# Patient Record
Sex: Male | Born: 1955 | Race: White | Hispanic: No | Marital: Married | Smoking: Former smoker
Health system: Southern US, Community
[De-identification: ages and names within clinical notes are randomized; demographics above are authoritative.]

## PROBLEM LIST (undated history)

## (undated) DIAGNOSIS — N2889 Other specified disorders of kidney and ureter: Secondary | ICD-10-CM

## (undated) HISTORY — PX: CHOLECYSTECTOMY: SHX55

## (undated) HISTORY — PX: ANTERIOR CRUCIATE LIGAMENT REPAIR: SHX115

---

## 2011-07-26 ENCOUNTER — Other Ambulatory Visit (HOSPITAL_BASED_OUTPATIENT_CLINIC_OR_DEPARTMENT_OTHER): Payer: Self-pay | Admitting: Family Medicine

## 2011-07-26 DIAGNOSIS — R109 Unspecified abdominal pain: Secondary | ICD-10-CM

## 2011-07-27 ENCOUNTER — Ambulatory Visit (HOSPITAL_BASED_OUTPATIENT_CLINIC_OR_DEPARTMENT_OTHER)
Admission: RE | Admit: 2011-07-27 | Discharge: 2011-07-27 | Disposition: A | Discharge status: Home / Self Care | Payer: PRIVATE HEALTH INSURANCE | Source: Ambulatory Visit | Attending: Family Medicine | Admitting: Family Medicine

## 2011-07-27 DIAGNOSIS — K4020 Bilateral inguinal hernia, without obstruction or gangrene, not specified as recurrent: Secondary | ICD-10-CM

## 2011-07-27 DIAGNOSIS — K4090 Unilateral inguinal hernia, without obstruction or gangrene, not specified as recurrent: Secondary | ICD-10-CM | POA: Insufficient documentation

## 2011-07-27 DIAGNOSIS — R109 Unspecified abdominal pain: Secondary | ICD-10-CM | POA: Insufficient documentation

## 2011-07-27 MED ORDER — IOHEXOL 300 MG/ML  SOLN
100.0000 mL | Freq: Once | INTRAMUSCULAR | Status: AC | PRN
  Administered 2011-07-27: 100 mL via INTRAVENOUS

## 2011-08-02 ENCOUNTER — Other Ambulatory Visit (HOSPITAL_BASED_OUTPATIENT_CLINIC_OR_DEPARTMENT_OTHER): Payer: Self-pay | Admitting: Family Medicine

## 2011-08-02 ENCOUNTER — Other Ambulatory Visit (HOSPITAL_BASED_OUTPATIENT_CLINIC_OR_DEPARTMENT_OTHER): Payer: PRIVATE HEALTH INSURANCE

## 2011-08-02 DIAGNOSIS — M545 Low back pain: Secondary | ICD-10-CM

## 2011-08-09 ENCOUNTER — Other Ambulatory Visit (HOSPITAL_BASED_OUTPATIENT_CLINIC_OR_DEPARTMENT_OTHER): Payer: PRIVATE HEALTH INSURANCE

## 2016-08-20 ENCOUNTER — Emergency Department
Admission: EM | Admit: 2016-08-20 | Discharge: 2016-08-20 | Discharge status: Against Medical Advice | Payer: BLUE CROSS/BLUE SHIELD | Source: Home / Self Care | Attending: Family Medicine | Admitting: Family Medicine

## 2016-08-20 ENCOUNTER — Encounter: Payer: Self-pay | Admitting: Emergency Medicine

## 2016-08-20 ENCOUNTER — Emergency Department (INDEPENDENT_AMBULATORY_CARE_PROVIDER_SITE_OTHER): Payer: BLUE CROSS/BLUE SHIELD

## 2016-08-20 DIAGNOSIS — R0602 Shortness of breath: Secondary | ICD-10-CM | POA: Diagnosis not present

## 2016-08-20 DIAGNOSIS — G8929 Other chronic pain: Secondary | ICD-10-CM

## 2016-08-20 DIAGNOSIS — M546 Pain in thoracic spine: Secondary | ICD-10-CM

## 2016-08-20 DIAGNOSIS — R079 Chest pain, unspecified: Secondary | ICD-10-CM

## 2016-08-20 HISTORY — DX: Other specified disorders of kidney and ureter: N28.89

## 2016-08-20 NOTE — ED Provider Notes (Signed)
[facility]    CSN: [id number] Arrival date & time: 08/20/16  1605  First Provider Contact:  First MD Initiated Contact with Patient 08/20/16 1728        History   Chief Complaint Chief Complaint  Patient presents with  . Shortness of Breath    HPI Vernon Jenkins is a 60 y.o. male.   Patient complains of 2 to 3 week history of recurring sharp pain in his right posterior chest.  He denies cough and the pain is not pleuritic, although he feels somewhat short of breath.  He feels well otherwise.  He reports that he had undergone cholecystectomy in August, and he had had similar pain prior to his surgery.   The history is provided by the patient.    Past Medical History:  Diagnosis Date  . Left kidney mass     There are no active problems to display for this patient.   Past Surgical History:  Procedure Laterality Date  . ANTERIOR CRUCIATE LIGAMENT REPAIR Left    knee  . CHOLECYSTECTOMY         Home Medications    Prior to Admission medications   Medication Sig Start Date End Date Taking? Authorizing Provider  lamoTRIgine (LAMICTAL) 200 MG tablet Take 200 mg by mouth daily.   Yes Historical Provider, MD  paliperidone (INVEGA) 6 MG 24 hr tablet Take 6 mg by mouth daily.   Yes Historical Provider, MD    Family History No family history on file.  Social History Social History  Substance Use Topics  . Smoking status: Current Every Day Smoker    Packs/day: 1.00  . Smokeless tobacco: Never Used  . Alcohol use Yes     Comment: 7 drinks a week     Allergies   Penicillins   Review of Systems Review of Systems  Constitutional: Negative.   HENT: Negative.   Eyes: Negative.   Respiratory: Negative.   Cardiovascular: Negative.   Gastrointestinal: Negative.   Genitourinary: Negative.   Musculoskeletal: Positive for back pain.  Skin: Negative.   Neurological: Negative.      Physical Exam Triage Vital Signs ED Triage Vitals  Enc Vitals  Group     BP 08/20/16 1645 151/85     Pulse Rate 08/20/16 1645 63     Resp --      Temp 08/20/16 1645 98.3 F (36.8 C)     Temp Source 08/20/16 1645 Oral     SpO2 08/20/16 1645 96 %     Weight 08/20/16 1645 229 lb 8 oz (104.1 kg)     Height 08/20/16 1645 6\' 2"  (1.88 m)     Head Circumference --      Peak Flow --      Pain Score 08/20/16 1649 8     Pain Loc --      Pain Edu? --      Excl. in GC? --    No data found.   Updated Vital Signs BP 151/85 (BP Location: Left Arm)   Pulse 63   Temp 98.3 F (36.8 C) (Oral)   Ht 6\' 2"  (1.88 m)   Wt 229 lb 8 oz (104.1 kg)   SpO2 96%   BMI 29.47 kg/m   Visual Acuity Right Eye Distance:   Left Eye Distance:   Bilateral Distance:    Right Eye Near:   Left Eye Near:    Bilateral Near:     Physical Exam  Constitutional: He appears well-developed and  well-nourished. No distress.  HENT:  Head: Normocephalic.  Right Ear: External ear normal.  Left Ear: External ear normal.  Nose: Nose normal.  Mouth/Throat: Oropharynx is clear and moist.  Eyes: EOM are normal. Pupils are equal, round, and reactive to light.  Neck: Normal range of motion. Neck supple.  Cardiovascular: Normal heart sounds.   Pulmonary/Chest: Breath sounds normal.     He exhibits no tenderness.  Patient has pain in his right posterior chest as noted on diagram.  However, there is no tenderness to palpation at this site.  Skin appears normal.  Abdominal: Soft. There is no tenderness.  Musculoskeletal: He exhibits no edema.  Lymphadenopathy:    He has no cervical adenopathy.  Neurological: He is alert.  Skin: Skin is warm and dry. No rash noted.  Nursing note and vitals reviewed.    UC Treatments / Results  Labs (all labs ordered are listed, but only abnormal results are displayed) Labs Reviewed - No data to display  EKG  EKG Interpretation None       Radiology Dg Chest 2 View  Result Date: 08/20/2016 CLINICAL DATA:  Chest pain and shortness of  breath for 2 weeks. EXAM: CHEST  2 VIEW COMPARISON:  None. FINDINGS: The cardiomediastinal silhouette is unremarkable. There is no evidence of focal airspace disease, pulmonary edema, suspicious pulmonary nodule/mass, pleural effusion, or pneumothorax. No acute bony abnormalities are identified. IMPRESSION: No active cardiopulmonary disease. Electronically Signed   By: Harmon PierJeffrey  Hu M.D.   On: 08/20/2016 17:04   Dg Thoracic Spine 2 View  Result Date: 08/20/2016 CLINICAL DATA:  Chronic right-sided upper back pain. Initial encounter. EXAM: THORACIC SPINE 2 VIEWS COMPARISON:  None. FINDINGS: There is no evidence of fracture or subluxation. Vertebral bodies demonstrate normal height and alignment. Intervertebral disc spaces are preserved. Mild lateral and anterior osteophyte formation is noted along the thoracic spine. The visualized portions of both lungs are clear. The mediastinum is unremarkable in appearance. IMPRESSION: No evidence of fracture or subluxation along the thoracic spine. Electronically Signed   By: Roanna RaiderJeffery  Chang M.D.   On: 08/20/2016 18:16    Procedures Procedures (including critical care time)  Medications Ordered in UC Medications - No data to display   Initial Impression / Assessment and Plan / UC Course  I have reviewed the triage vital signs and the nursing notes.  Pertinent labs & imaging results that were available during my care of the patient were reviewed by me and considered in my medical decision making (see chart for details).  Clinical Course  Negative chest X-ray reassuring. ?thoracic radiculopathy. Patient left before final results thoracic spine x-ray available. Recommend follow-up with PCP; may benefit from trial of prednisone.    Final Clinical Impressions(s) / UC Diagnoses   Final diagnoses:  Right-sided thoracic back pain    New Prescriptions Discharge Medication List as of 08/20/2016  6:37 PM       Lattie HawStephen A Beese, MD 08/29/16 1035

## 2016-08-20 NOTE — ED Triage Notes (Signed)
Pt c/o pain when taking deep breathes x 2 weeks, feels pain in his right lung and chest.  Pt is having problems w/laying down, SOB and productive cough.

## 2016-08-24 ENCOUNTER — Telehealth: Payer: Self-pay

## 2016-08-24 NOTE — Telephone Encounter (Signed)
Feeling much better.  Saw PCP and issues have resolved.

## 2017-02-13 IMAGING — DX DG THORACIC SPINE 2V
4 series · 4 of 4 positions shown · non-contrast
Comparison: None.

CLINICAL DATA: Chronic right-sided upper back pain. Initial
encounter.

EXAM:
THORACIC SPINE 2 VIEWS

[t-spine lat (1 of 2)]
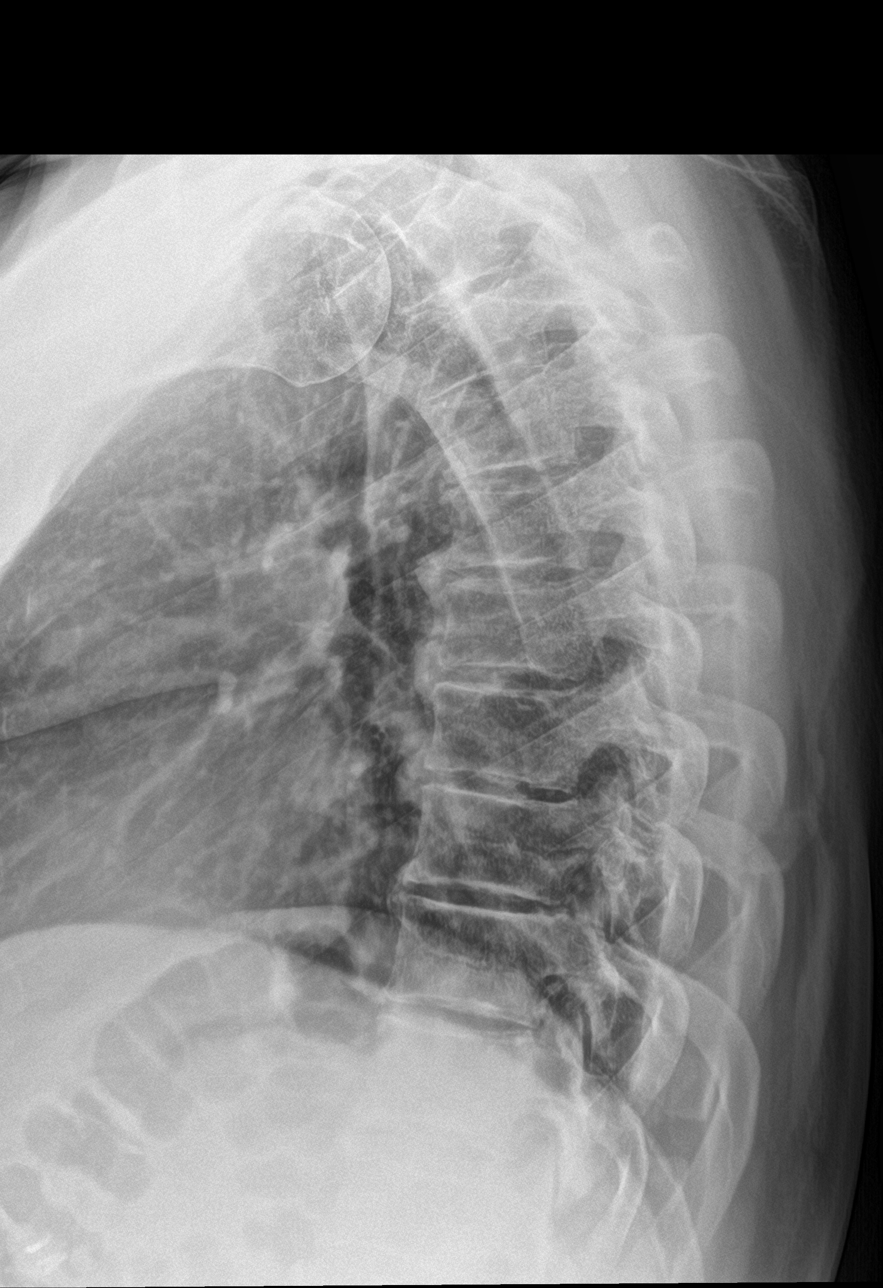

[t-spine swimmers]
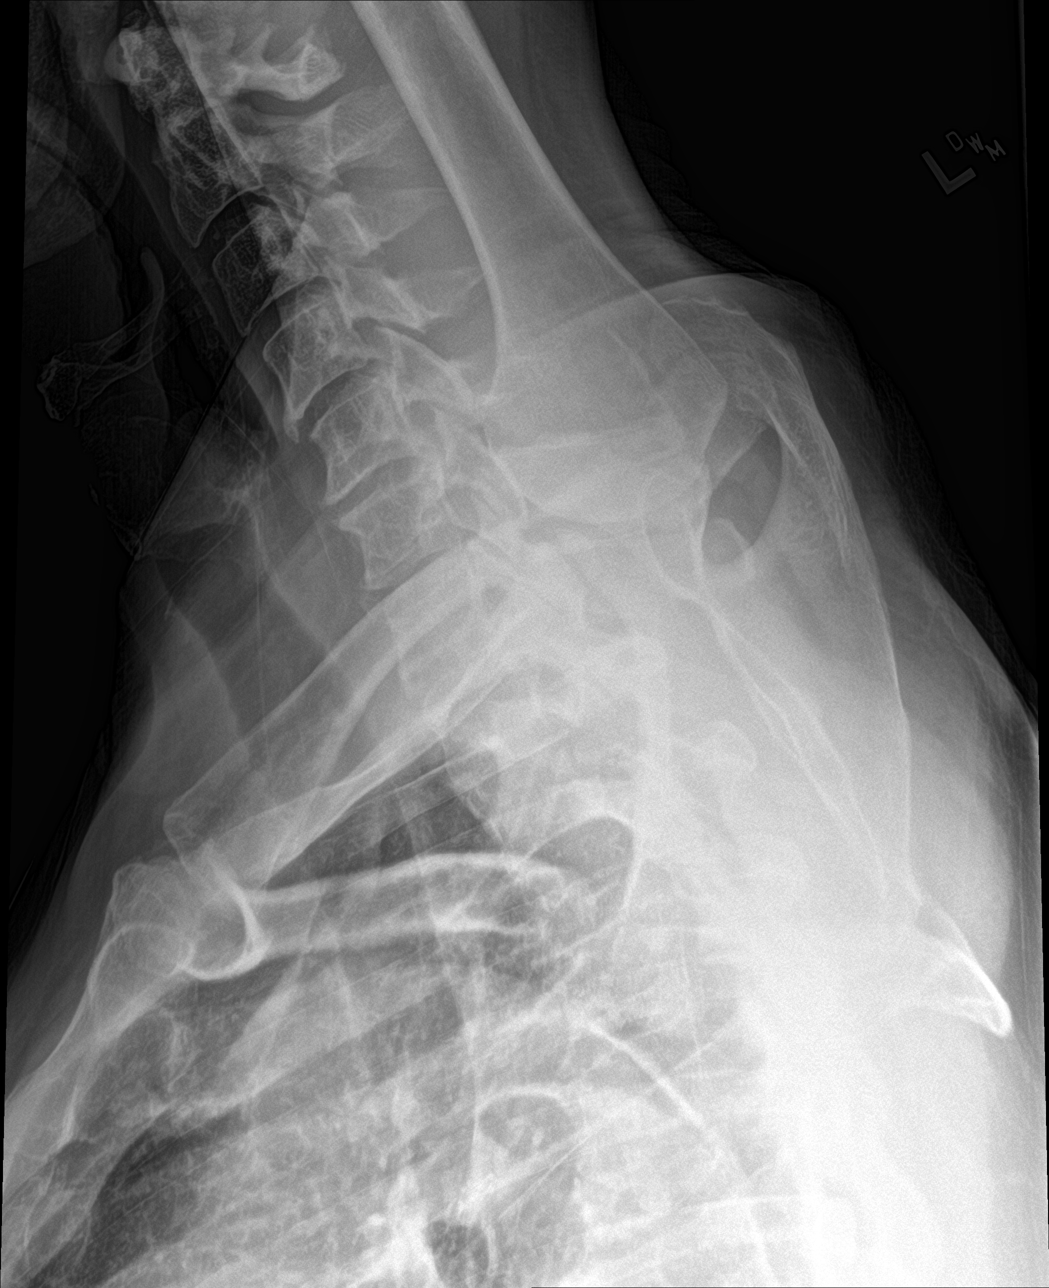

[t-spine ap]
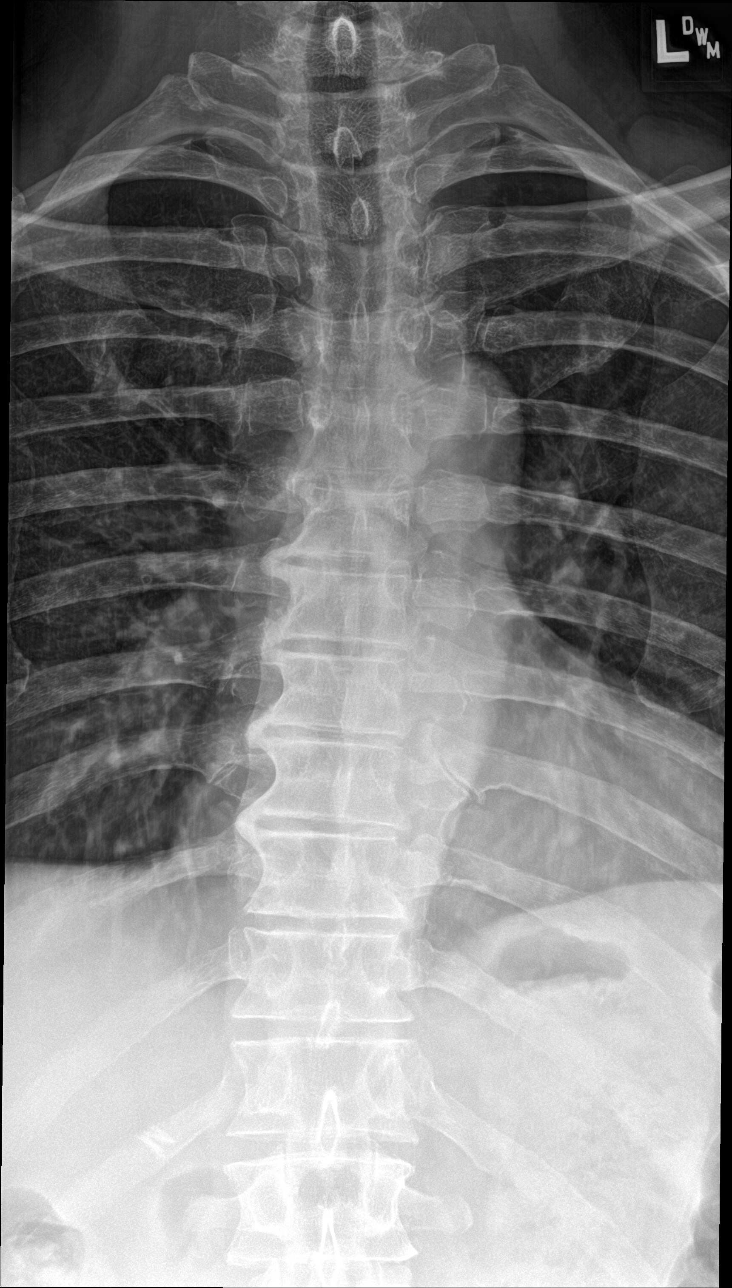

[t-spine lat (2 of 2)]
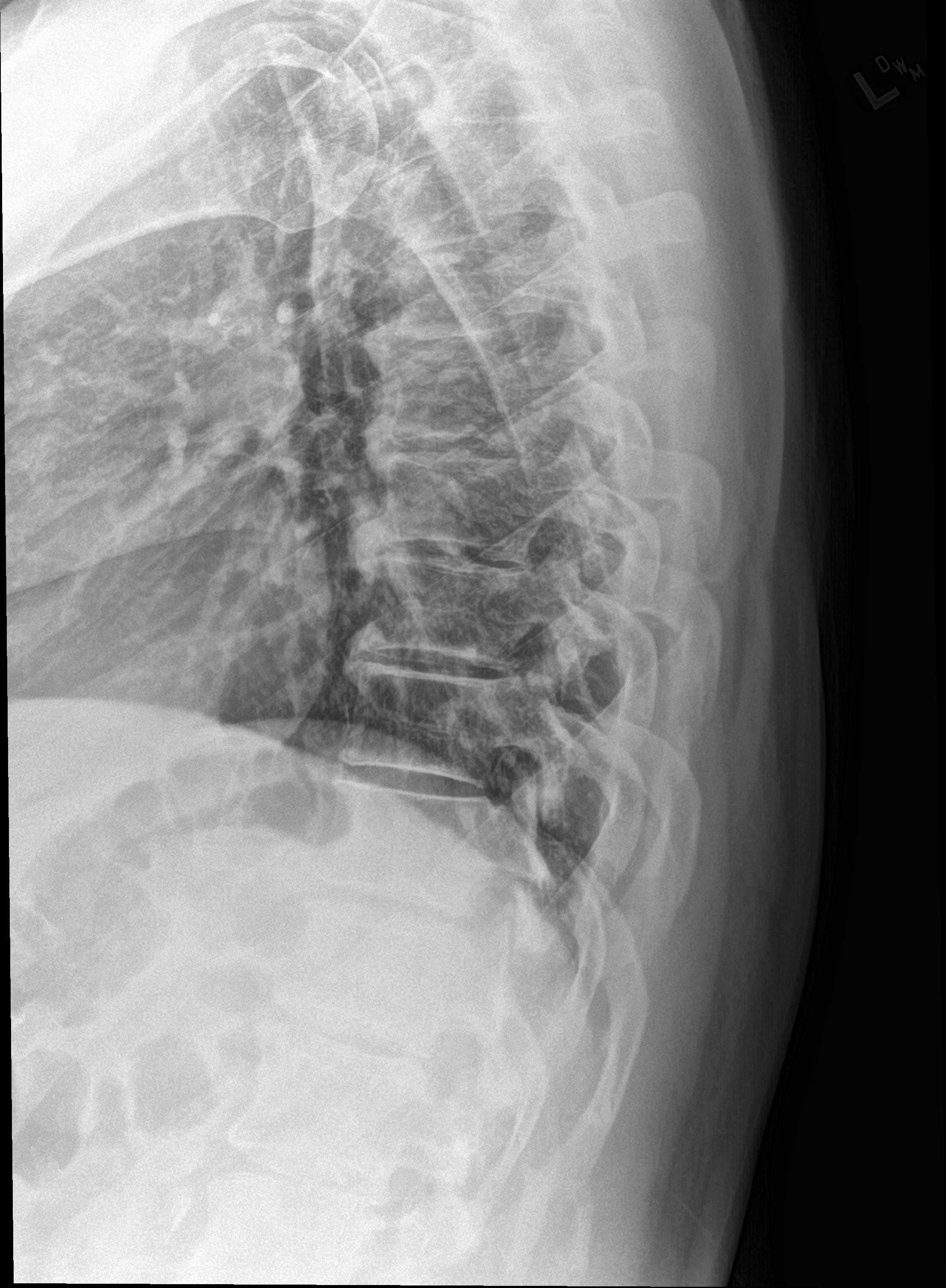

[4 of 4 positions shown; findings below may reference images not displayed]

FINDINGS: There is no evidence of fracture or subluxation. Vertebral bodies
demonstrate normal height and alignment. Intervertebral disc spaces
are preserved. Mild lateral and anterior osteophyte formation is
noted along the thoracic spine.

The visualized portions of both lungs are clear. The mediastinum is
unremarkable in appearance.
IMPRESSION: No evidence of fracture or subluxation along the thoracic spine.

## 2018-09-04 ENCOUNTER — Ambulatory Visit: Payer: 59 | Admitting: Physician Assistant

## 2018-09-04 ENCOUNTER — Encounter: Payer: Self-pay | Admitting: Physician Assistant

## 2018-09-04 DIAGNOSIS — F319 Bipolar disorder, unspecified: Secondary | ICD-10-CM

## 2018-09-04 MED ORDER — LAMOTRIGINE 200 MG PO TABS: 200.0000 mg | ORAL_TABLET | Freq: Every day | ORAL | 3 refills | Status: DC

## 2018-09-04 MED ORDER — PALIPERIDONE ER 6 MG PO TB24: 6.0000 mg | ORAL_TABLET | Freq: Every day | ORAL | 3 refills | Status: DC

## 2018-09-04 NOTE — Progress Notes (Signed)
[facility]  Patient ID: Vernon Jenkins,  MRN: [id number]  PCP: Salli Quarry, PA-C  Date of Evaluation: 09/04/2018 Time spent:15 minutes   HISTORY/CURRENT STATUS: HPI patient presents for his annual medication checkup.  He states he is doing very well as far as his mental health is concerned. He denies anhedonia, decreased energy and motivation, and he sleeps well.  He is not isolating at all.  Work is very busy but is going well.  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 No increased energy with decreased need for sleep, no increased libido or spending, no gambling no risky or impulsive behaviors, no grandiosity. He had renal cell carcinoma a couple of years ago and has done well without recurrence until some point in the past year when he had a lesion on his back that was thought to be a cyst.  However when it was removed it was positive for what appeared to be a metastatic renal cell carcinoma lesion.  After removal, no other treatment was necessary.  He has every 71-month CT scans and has had no recurrence of the cancer.  He feels well otherwise.       Individual Medical History/ Review of Systems: Changes? :Yes see above  Allergies: Penicillins  Current Medications:  Current Outpatient Medications:  .  lamoTRIgine (LAMICTAL) 200 MG tablet, Take 1 tablet (200 mg total) by mouth daily., Disp: 90 tablet, Rfl: 3 .  paliperidone (INVEGA) 6 MG 24 hr tablet, Take 1 tablet (6 mg total) by mouth daily., Disp: 90 tablet, Rfl: 3 Medication Side Effects: None  Family Medical/ Social History: Changes? No  MENTAL HEALTH EXAM:  There were no vitals taken for this visit.There is no height or weight on file to calculate BMI.  General Appearance: Well Groomed  Eye Contact:  Good  Speech:  Clear and Coherent  Volume:  Normal  Mood:  Euthymic  Affect:  Appropriate  Thought  Process:  Goal Directed  Orientation:  Full (Time, Place, and Person)  Thought Content: Logical   Suicidal Thoughts:  No  Homicidal Thoughts:  No  Memory:  Immediate  Judgement:  Good  Insight:  Good  Psychomotor Activity:  Normal  Concentration:  Concentration: Good  Recall:  Good  Fund of Knowledge: Good  Language: Good  Akathisia:  No  AIMS (if indicated): not done  Assets:  Communication Skills  ADL's:  Intact  Cognition: WNL  Prognosis:  Good    DIAGNOSES:    ICD-10-CM   1. Bipolar I disorder (HCC) F31.9     RECOMMENDATIONS: Patient will continue the in Vega 6 mg 1 p.o. daily and Lamictal 200 mg 1 p.o. daily. He has had numerous normal labs over the past year due to his history of renal cell carcinoma therefore I will not reorder any labs. Return office visit 1 year    Melony Overly, [place]

## 2019-09-03 ENCOUNTER — Other Ambulatory Visit: Payer: Self-pay | Admitting: Physician Assistant

## 2019-09-09 ENCOUNTER — Ambulatory Visit: Payer: 59 | Admitting: Physician Assistant

## 2019-09-29 ENCOUNTER — Other Ambulatory Visit: Payer: Self-pay

## 2019-09-29 ENCOUNTER — Encounter: Payer: Self-pay | Admitting: Physician Assistant

## 2019-09-29 ENCOUNTER — Ambulatory Visit (INDEPENDENT_AMBULATORY_CARE_PROVIDER_SITE_OTHER): Payer: 59 | Admitting: Physician Assistant

## 2019-09-29 DIAGNOSIS — F319 Bipolar disorder, unspecified: Secondary | ICD-10-CM

## 2019-09-29 MED ORDER — PALIPERIDONE ER 6 MG PO TB24: 6.0000 mg | ORAL_TABLET | Freq: Every day | ORAL | 11 refills | Status: DC

## 2019-09-29 MED ORDER — LAMOTRIGINE 200 MG PO TABS: 200.0000 mg | ORAL_TABLET | Freq: Every day | ORAL | 11 refills | Status: DC

## 2019-09-29 NOTE — Progress Notes (Signed)
[facility]  Patient ID: Vernon Jenkins,  MRN: [id number]  PCP: Salli Quarry, PA-C  Date of Evaluation: 09/29/2019 Time spent:15 minutes  Chief Complaint:  Chief Complaint    Follow-up     Virtual Visit via Telephone Note  I connected with patient by a video enabled telemedicine application or telephone, with their informed consent, and verified patient privacy and that I am speaking with the correct person using two identifiers.  I am private, in my office and the patient is at work.   I discussed the limitations, risks, security and privacy concerns of performing an evaluation and management service by telephone and the availability of in person appointments. I also discussed with the patient that there may be a patient responsible charge related to this service. The patient expressed understanding and agreed to proceed.   I discussed the assessment and treatment plan with the patient. The patient was provided an opportunity to ask questions and all were answered. The patient agreed with the plan and demonstrated an understanding of the instructions.   The patient was advised to call back or seek an in-person evaluation if the symptoms worsen or if the condition fails to improve as anticipated.  I provided 15 minutes of non-face-to-face time during this encounter.  HISTORY/CURRENT STATUS: HPI For routine annual med check.  States he is doing great!  COVID has not affected him at all.  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  States he feels great physically and got a good report recently from the oncologist.  See below.  Patient denies loss of interest in usual activities and is able to enjoy things.  Denies decreased energy or motivation.  Appetite has not changed.  No extreme sadness, tearfulness, or feelings of hopelessness.  Denies any changes in concentration, making decisions or remembering things.  Denies suicidal or homicidal  thoughts.  Patient denies increased energy with decreased need for sleep, no increased talkativeness, no racing thoughts, no impulsivity or risky behaviors, no increased spending, no increased libido, no grandiosity.  Denies dizziness, syncope, seizures, numbness, tingling, tremor, tics, unsteady gait, slurred speech, confusion. Denies muscle or joint pain, stiffness, or dystonia.  Individual Medical History/ Review of Systems: Changes? :No  Has now gone to annual exams with his oncologist.  Several months ago, he had a CT scan of his abdomen and pelvis which were negative.  He has a history of metastatic renal cell carcinoma.  Has not had treatment except for surgically.  Allergies: Penicillins  Current Medications:  Current Outpatient Medications:  .  lamoTRIgine (LAMICTAL) 200 MG tablet, Take 1 tablet (200 mg total) by mouth daily., Disp: 30 tablet, Rfl: 11 .  paliperidone (INVEGA) 6 MG 24 hr tablet, Take 1 tablet (6 mg total) by mouth daily., Disp: 30 tablet, Rfl: 11 Medication Side Effects: none  Family Medical/ Social History: Changes? No  MENTAL HEALTH EXAM:  There were no vitals taken for this visit.There is no height or weight on file to calculate BMI.  General Appearance: Unable to assess  Eye Contact:  Unable to assess  Speech:  Clear and Coherent  Volume:  Normal  Mood:  Euthymic  Affect:  Unable to assess  Thought Process:  Goal Directed and Descriptions of Associations: Intact  Orientation:  Full (Time, Place, and Person)  Thought Content: Logical   Suicidal Thoughts:  No  Homicidal Thoughts:  No  Memory:  WNL  Judgement:  Good  Insight:  Good  Psychomotor Activity:  Unable to assess  Concentration:  Concentration: Good  Recall:  Good  Fund of Knowledge: Good  Language: Good  Assets:  Desire for Improvement  ADL's:  Intact  Cognition: WNL  Prognosis:  Good  01/14/2019 glucose was 82  DIAGNOSES:    ICD-10-CM   1. Bipolar I disorder ([place])  F31.9      Receiving Psychotherapy: No    RECOMMENDATIONS:  I am glad to see him doing so well both physically and mentally! Continue Lamictal 200 mg 1 p.o. daily. Continue Invega 6 mg daily. He is having labs at other providers so I will not order at present. Return in 1 year.  Donnal Moat, PA-C

## 2020-02-04 ENCOUNTER — Other Ambulatory Visit: Payer: Self-pay

## 2020-02-04 ENCOUNTER — Encounter: Payer: Self-pay | Admitting: Physician Assistant

## 2020-02-04 ENCOUNTER — Ambulatory Visit (INDEPENDENT_AMBULATORY_CARE_PROVIDER_SITE_OTHER): Payer: 59 | Admitting: Physician Assistant

## 2020-02-04 DIAGNOSIS — F319 Bipolar disorder, unspecified: Secondary | ICD-10-CM

## 2020-02-04 MED ORDER — RISPERIDONE 4 MG PO TABS: 4.0000 mg | ORAL_TABLET | Freq: Every day | ORAL | 6 refills | Status: DC

## 2020-02-04 MED ORDER — RISPERIDONE 4 MG PO TABS
4.0000 mg | ORAL_TABLET | Freq: Every day | ORAL | 1 refills | Status: DC
Start: 2020-02-04 — End: 2020-02-04

## 2020-02-04 NOTE — Progress Notes (Signed)
[facility]  Patient ID: Vernon Jenkins,  MRN: [id number]  PCP: Salli Quarry, PA-C  Date of Evaluation: 02/04/2020 Time spent:20 minutes  Chief Complaint:   HISTORY/CURRENT STATUS: HPI Needs to discuss changing Invega.  Insurance has changed, Hinda Glatter is now around $600 per month, but can get it for $200+ with [employer]. "I can't afford that." Has been stable on it and Lamictal for over 10 years and has done very well on it.    Individual Medical History/ Review of Systems: Changes? :Yes    Past medications for mental health diagnoses include: Cymbalta caused him to be hyper and jittery, Trileptal  Allergies: Penicillins  Current Medications:  Current Outpatient Medications:  .  lamoTRIgine (LAMICTAL) 200 MG tablet, Take 1 tablet (200 mg total) by mouth daily., Disp: 30 tablet, Rfl: 11 .  risperidone (RISPERDAL) 4 MG tablet, Take 1 tablet (4 mg total) by mouth daily., Disp: 30 tablet, Rfl: 6 Medication Side Effects: none  Family Medical/ Social History: Changes? No  MENTAL HEALTH EXAM:  There were no vitals taken for this visit.There is no height or weight on file to calculate BMI.  General Appearance: Casual, Neat and Well Groomed  Eye Contact:  Good  Speech:  Clear and Coherent and Normal Rate  Volume:  Normal  Mood:  Euthymic  Affect:  Appropriate  Thought Process:  Goal Directed  Orientation:  Full (Time, Place, and Person)  Thought Content: Logical   Suicidal Thoughts:  No  Homicidal Thoughts:  No  Memory:  WNL  Judgement:  Good  Insight:  Good  Psychomotor Activity:  Normal  Concentration:  Concentration: Good  Recall:  Good  Fund of Knowledge: Good  Language: Good  Assets:  Desire for Improvement  ADL's:  Intact  Cognition: WNL  Prognosis:  Good  01/20/20 Glu was 93, all other labs on CMP were nl.   DIAGNOSES:    ICD-10-CM   1. Bipolar I disorder (HCC)  F31.9     Receiving Psychotherapy: No    RECOMMENDATIONS:  PDMP reviewed. I spent  25 minutes with him.  Discussed w/ Dr. Jennelle Human. Recommend change to Risperdal. D/C Invega  Start Risperdal 4 mg, 1 p.o. nightly.  If he can tolerate it during the day it is okay to take it during the day, as has been his custom with the Invega. Continue Lamictal 200 mg, 1 p.o. daily. As long as he is doing well, we will see each other at his scheduled appointment in October.  If he has any trouble with his transition, he should call and we might be able to handle the concerns over the phone.  If not we may need to see him sooner.  He understands. Return in October 2021.  Melony Overly, PA-C

## 2020-03-15 ENCOUNTER — Telehealth: Payer: Self-pay | Admitting: Physician Assistant

## 2020-03-15 NOTE — Telephone Encounter (Signed)
Pt stated he is having a reaction from a new med he's been on for a month. He stated he is experiencing dizziness and lightheadedness. He would like to explore other options.

## 2020-03-15 NOTE — Telephone Encounter (Signed)
How has his mood been?  If stable and he would like to continue to try the risperidone because it is the closest thing to [place], we could decrease the risperidone to either 2 or 3 mg and see how he does on that.  Another alternative would be to change antipsychotics.  I would probably recommend either Zyprexa or Seroquel.I do not want [place] to overlook anything physical that could be going on though.  I know he has had recent negative follow-up exams for cancer, but I think he should contact his PCP concerning the dizziness, no matter what changes we make.

## 2020-03-16 NOTE — Telephone Encounter (Signed)
Noted  

## 2020-04-12 ENCOUNTER — Telehealth: Payer: Self-pay | Admitting: Physician Assistant

## 2020-04-12 ENCOUNTER — Other Ambulatory Visit: Payer: Self-pay | Admitting: Physician Assistant

## 2020-04-12 MED ORDER — PALIPERIDONE ER 3 MG PO TB24: 3.0000 mg | ORAL_TABLET | Freq: Every day | ORAL | 1 refills | Status: DC

## 2020-04-12 NOTE — Telephone Encounter (Signed)
We can switch back to Invega, 3 mg for a month, then increase back to 6 mg after that, if needed. I'll give him a 30 day supply and then he can call on the 3rd week of that, letting me know if that's working or we need to increase, then I'll send in Rx for that.  The cheapest place today is [facility], [phone] for #30 Let me know where to send it.

## 2020-04-12 NOTE — Telephone Encounter (Signed)
Patient called and said that  He is stilll having problems with the respidone. He has cut the dose in half and is experiencing dizzyness, lightheaded and off balance. He would like to go back on invega but needs to know how to do that. Please give him a call at [phone]

## 2020-04-12 NOTE — Telephone Encounter (Signed)
Patient called back and I read the message to him. He wants the invega sent to [facility] in [place]. He also wants to know if he needs to stop the respirdone cold [place] or wean off of it

## 2020-04-12 NOTE — Telephone Encounter (Signed)
Yes, he can stop the Risperdal and go straight to the [place].

## 2020-04-13 NOTE — Telephone Encounter (Signed)
Patient aware to stop risperdal and start Invega. He said he's been having this lightheadedness and dizziness since starting risperdal but he also got his 1st Covid vaccine a couple days later. He did cut back his risperdal dose as recommended which helped slightly but then received his 2 nd Covid vaccine and symptoms worsened. Advised him for some reason if symptoms don't improve to contact the office. He agreed.

## 2020-05-04 ENCOUNTER — Other Ambulatory Visit: Payer: Self-pay

## 2020-05-04 ENCOUNTER — Telehealth: Payer: Self-pay | Admitting: Physician Assistant

## 2020-05-04 MED ORDER — PALIPERIDONE ER 3 MG PO TB24: 3.0000 mg | ORAL_TABLET | Freq: Every day | ORAL | 5 refills | Status: DC

## 2020-05-04 NOTE — Telephone Encounter (Signed)
Pt doing well on his Paliperidone 3mg  and would like a refill sent in at [facility] in [place].

## 2020-05-04 NOTE — Telephone Encounter (Signed)
RX sent

## 2020-09-20 ENCOUNTER — Encounter: Payer: Self-pay | Admitting: Physician Assistant

## 2020-09-20 ENCOUNTER — Ambulatory Visit (INDEPENDENT_AMBULATORY_CARE_PROVIDER_SITE_OTHER): Payer: 59 | Admitting: Physician Assistant

## 2020-09-20 ENCOUNTER — Other Ambulatory Visit: Payer: Self-pay

## 2020-09-20 DIAGNOSIS — F319 Bipolar disorder, unspecified: Secondary | ICD-10-CM

## 2020-09-20 MED ORDER — PALIPERIDONE ER 3 MG PO TB24: 3.0000 mg | ORAL_TABLET | Freq: Every day | ORAL | 5 refills | Status: DC

## 2020-09-20 MED ORDER — LAMOTRIGINE 200 MG PO TABS: 200.0000 mg | ORAL_TABLET | Freq: Every day | ORAL | 5 refills | Status: DC

## 2020-09-20 NOTE — Progress Notes (Signed)
[facility]  Patient ID: Vernon Jenkins,  MRN: [id number]  PCP: Salli Quarry, PA-C  Date of Evaluation: 09/20/2020 Time spent:20 minutes  Chief Complaint:  Chief Complaint    Follow-up      HISTORY/CURRENT STATUS: HPI For routine 62-month med check.  We changed the Invega to Risperdal d/t cost. But he got dizzy and had a lot of problems during that time so we went back to the [place].  We even decreased the dose and he states he is feeling better with that than he did with the higher dose.   He is not really sure that the Risperdal is what caused the dizziness.  He has seen an ENT and then was sent to audiology.  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  It is a lot better than it has been.  The first time he got dizzy, he started vomiting immediately and had to have his wife come pick him up from work.  The other episodes he has had have not been that bad.  The curious thing is that he had his second COVID shot right before the dizziness started.  The ENT told him he has been seeing that fairly often after the COVID shots.  Of course no one knows if it is related at all or not.  Patient denies loss of interest in usual activities and is able to enjoy things.  Denies decreased energy or motivation.  Appetite has not changed.  No extreme sadness, tearfulness, or feelings of hopelessness.  Denies any changes in concentration, making decisions or remembering things.  He sleeps well.  Denies suicidal or homicidal thoughts.  Patient denies increased energy with decreased need for sleep, no increased talkativeness, no racing thoughts, no impulsivity or risky behaviors, no increased spending, no increased libido, no grandiosity, no increased irritability or anger, no paranoia, and no hallucinations.  Denies syncope, seizures, numbness, tingling, tremor, tics, unsteady gait, slurred speech, confusion. Denies muscle or joint pain, stiffness, or  dystonia.   Individual Medical History/ Review of Systems: Changes? :Yes  Benign Paroxysmal  Vertigo  Past medications for mental health diagnoses include: Cymbalta caused him to be hyper and jittery, Trileptal, Risperdal   Allergies: Penicillins  Current Medications:  Current Outpatient Medications:  .  lamoTRIgine (LAMICTAL) 200 MG tablet, Take 1 tablet (200 mg total) by mouth daily., Disp: 30 tablet, Rfl: 5 .  paliperidone (INVEGA) 3 MG 24 hr tablet, Take 1 tablet (3 mg total) by mouth daily., Disp: 30 tablet, Rfl: 5 .  testosterone cypionate (DEPOTESTOSTERONE CYPIONATE) 200 MG/ML injection, SMARTSIG:0.7 Milliliter(s) IM Every 2 Weeks, Disp: , Rfl:  Medication Side Effects: none  Family Medical/ Social History: Changes? No  MENTAL HEALTH EXAM:  There were no vitals taken for this visit.There is no height or weight on file to calculate BMI.  General Appearance: Casual, Neat and Well Groomed  Eye Contact:  Good  Speech:  Clear and Coherent and Normal Rate  Volume:  Normal  Mood:  Euthymic  Affect:  Appropriate  Thought Process:  Goal Directed  Orientation:  Full (Time, Place, and Person)  Thought Content: Logical   Suicidal Thoughts:  No  Homicidal Thoughts:  No  Memory:  WNL  Judgement:  Good  Insight:  Good  Psychomotor Activity:  Normal  Concentration:  Concentration: Good and Attention Span: Good  Recall:  Good  Fund of Knowledge: Good  Language: Good  Assets:  Desire for Improvement  ADL's:  Intact  Cognition: WNL  Prognosis:  Good   Labs 07/19/2020 Glucose 90.  The remainder of the CMP is normal as well. No recent lipid profile has been done.  DIAGNOSES:    ICD-10-CM   1. Bipolar I disorder (HCC)  F31.9     Receiving Psychotherapy: No    RECOMMENDATIONS:  PDMP reviewed. I provided 20 minutes of face-to-face time during this encounter. I am glad to see him doing so well, except for the BPV and hope that resolves soon. Continue Lamictal 200 mg, 1 p.o.  daily. Continue Invega 3 mg, 1 p.o. daily. When he has a routine physical with routine labs, he will call and let me know they have been done so I can review in the chart. Return in 6 months.  Melony Overly, PA-C

## 2020-09-27 ENCOUNTER — Other Ambulatory Visit: Payer: Self-pay | Admitting: Physician Assistant

## 2020-12-07 ENCOUNTER — Other Ambulatory Visit: Payer: Self-pay | Admitting: Physician Assistant

## 2021-03-21 ENCOUNTER — Ambulatory Visit (INDEPENDENT_AMBULATORY_CARE_PROVIDER_SITE_OTHER): Payer: 59 | Admitting: Physician Assistant

## 2021-03-21 ENCOUNTER — Encounter: Payer: Self-pay | Admitting: Physician Assistant

## 2021-03-21 ENCOUNTER — Other Ambulatory Visit: Payer: Self-pay

## 2021-03-21 DIAGNOSIS — F319 Bipolar disorder, unspecified: Secondary | ICD-10-CM | POA: Diagnosis not present

## 2021-03-21 MED ORDER — PALIPERIDONE ER 3 MG PO TB24: ORAL_TABLET | ORAL | 5 refills | Status: DC

## 2021-03-21 MED ORDER — LAMOTRIGINE 200 MG PO TABS: ORAL_TABLET | ORAL | 5 refills | Status: DC

## 2021-03-21 NOTE — Progress Notes (Signed)
[facility]  Patient ID: Vernon Jenkins,  MRN: [id number]  PCP: Salli Quarry, PA-C  Date of Evaluation: 03/21/2021 Time spent:20 minutes  Chief Complaint:  Chief Complaint    Anxiety      HISTORY/CURRENT STATUS: HPI For routine med check.  Doing really well.  Physical health remains stable.  He continues close follow-up for history of kidney cancer and is cancer free.  He still works full-time and enjoys his job.  Things at home are going well.  He is able to enjoy things.  Energy and motivation are good.  Does not isolate.  Does not cry easily. Denies anxiety.  No suicidal or homicidal thoughts.  Patient denies increased energy with decreased need for sleep, no increased talkativeness, no racing thoughts, no impulsivity or risky behaviors, no increased spending, no increased libido, no grandiosity, no increased irritability or anger, and no hallucinations.  Denies dizziness, syncope, seizures, numbness, tingling, tremor, tics, unsteady gait, slurred speech, confusion. Denies muscle or joint pain, stiffness, or dystonia.  Individual Medical History/ Review of Systems: Changes? :No   Past medications for mental health diagnoses include: Cymbalta caused him to be hyper and jittery, Trileptal, Risperdal   Allergies: Penicillins  Current Medications:  Current Outpatient Medications:  .  meloxicam (MOBIC) 15 MG tablet, Take 1 tablet by mouth daily., Disp: , Rfl:  .  testosterone cypionate (DEPOTESTOSTERONE CYPIONATE) 200 MG/ML injection, SMARTSIG:0.7 Milliliter(s) IM Every 2 Weeks, Disp: , Rfl:  .  lamoTRIgine (LAMICTAL) 200 MG tablet, TAKE 1 TABLET(200 MG) BY MOUTH DAILY, Disp: 30 tablet, Rfl: 5 .  paliperidone (INVEGA) 3 MG 24 hr tablet, TAKE 1 TABLET(3 MG) BY MOUTH DAILY, Disp: 30 tablet, Rfl: 5 Medication Side Effects: none  Family Medical/ Social History: Changes? No  MENTAL HEALTH EXAM:  There were no vitals taken for this visit.There is no height or weight  on file to calculate BMI.  General Appearance: Casual and Well Groomed  Eye Contact:  Good  Speech:  Clear and Coherent and Normal Rate  Volume:  Normal  Mood:  Euthymic  Affect:  Appropriate  Thought Process:  Goal Directed and Descriptions of Associations: Intact  Orientation:  Full (Time, Place, and Person)  Thought Content: Logical   Suicidal Thoughts:  No  Homicidal Thoughts:  No  Memory:  WNL  Judgement:  Good  Insight:  Good  Psychomotor Activity:  Normal  Concentration:  Concentration: Good  Recall:  Good  Fund of Knowledge: Good  Language: Good  Assets:  Desire for Improvement  ADL's:  Intact  Cognition: WNL  Prognosis:  Good    DIAGNOSES:    ICD-10-CM   1. Bipolar I disorder (HCC)  F31.9     Receiving Psychotherapy: No    RECOMMENDATIONS:  PDMP was reviewed. I provided 20 minutes of face-to-face time during this encounter, including time spent before and after the visit in records review and charting. I am glad to see him doing so well!  No medication changes are necessary. Continue Lamictal 200 mg, 1 p.o. daily. Continue Invega 3 mg, 1 p.o. daily. Return in 6 months.  Melony Overly, PA-C

## 2021-09-20 ENCOUNTER — Other Ambulatory Visit: Payer: Self-pay

## 2021-09-20 ENCOUNTER — Encounter: Payer: Self-pay | Admitting: Physician Assistant

## 2021-09-20 ENCOUNTER — Ambulatory Visit (INDEPENDENT_AMBULATORY_CARE_PROVIDER_SITE_OTHER): Payer: 59 | Admitting: Physician Assistant

## 2021-09-20 DIAGNOSIS — F319 Bipolar disorder, unspecified: Secondary | ICD-10-CM | POA: Diagnosis not present

## 2021-09-20 MED ORDER — LAMOTRIGINE 200 MG PO TABS: ORAL_TABLET | ORAL | 5 refills | Status: DC

## 2021-09-20 MED ORDER — PALIPERIDONE ER 3 MG PO TB24: ORAL_TABLET | ORAL | 5 refills | Status: DC

## 2021-09-20 NOTE — Progress Notes (Signed)
[facility]  Patient ID: Vernon Jenkins,  MRN: [id number]  PCP: Salli Quarry, PA-C  Date of Evaluation: 09/20/2021 Time spent:20 minutes  Chief Complaint:  Chief Complaint   Follow-up      HISTORY/CURRENT STATUS: HPI For routine med check.  Doing really well.  Physical health remains stable.  He still has routine follow-up for history of kidney cancer with partial nephrectomy 5 years ago exactly.  Labs have been fine and he has not had to have a CT scan in a while.    He still works full-time and enjoys his job. He is able to enjoy things.  Energy and motivation are good.  Does not isolate.  Does not cry easily. Denies anxiety.  No suicidal or homicidal thoughts.  He sleeps well, not having any anxiety to speak of.  He feels very stable on his current medication regimen.  Had labs recently.  Patient denies increased energy with decreased need for sleep, no increased talkativeness, no racing thoughts, no impulsivity or risky behaviors, no increased spending, no increased libido, no grandiosity, no increased irritability or anger, and no hallucinations.  Denies dizziness, syncope, seizures, numbness, tingling, tremor, tics, unsteady gait, slurred speech, confusion. Denies muscle or joint pain, stiffness, or dystonia.  Individual Medical History/ Review of Systems: Changes? :No   Past medications for mental health diagnoses include: Cymbalta caused him to be hyper and jittery, Trileptal, Risperdal   Allergies: Penicillins  Current Medications:  Current Outpatient Medications:    b complex vitamins capsule, Take 1 capsule by mouth daily., Disp: , Rfl:    meloxicam (MOBIC) 15 MG tablet, Take 1 tablet by mouth daily. prn, Disp: , Rfl:    testosterone cypionate (DEPOTESTOSTERONE CYPIONATE) 200 MG/ML injection, SMARTSIG:0.7 Milliliter(s) IM Every 2 Weeks, Disp: , Rfl:    lamoTRIgine (LAMICTAL) 200 MG tablet, TAKE 1 TABLET(200 MG) BY MOUTH DAILY, Disp: 30 tablet, Rfl:  5   paliperidone (INVEGA) 3 MG 24 hr tablet, TAKE 1 TABLET(3 MG) BY MOUTH DAILY, Disp: 30 tablet, Rfl: 5 Medication Side Effects: none  Family Medical/ Social History: Changes? No  MENTAL HEALTH EXAM:  There were no vitals taken for this visit.There is no height or weight on file to calculate BMI.  General Appearance: Casual and Well Groomed  Eye Contact:  Good  Speech:  Clear and Coherent and Normal Rate  Volume:  Normal  Mood:  Euthymic  Affect:  Appropriate  Thought Process:  Goal Directed and Descriptions of Associations: Circumstantial  Orientation:  Full (Time, Place, and Person)  Thought Content: Logical   Suicidal Thoughts:  No  Homicidal Thoughts:  No  Memory:  WNL  Judgement:  Good  Insight:  Good  Psychomotor Activity:  Normal  Concentration:  Concentration: Good  Recall:  Good  Fund of Knowledge: Good  Language: Good  Assets:  Desire for Improvement  ADL's:  Intact  Cognition: WNL  Prognosis:  Good   Labs  07/29/2021 CBC with differential completely normal CMP glucose 96, all values normal 07/13/2021  hemoglobin A1c 5.1 Lipid panel total cholesterol 189, triglycerides 102, HDL 52, LDL 119  DIAGNOSES:    ICD-10-CM   1. Bipolar I disorder (HCC)  F31.9        Receiving Psychotherapy: No    RECOMMENDATIONS:  PDMP was reviewed. No mental health controlled substances.  Testosterone noted. I provided 30 minutes of face to face time during this encounter, including time spent before and after the visit in records review, medical decision making, and  charting.  I am glad to see him doing so well!  Labs were reviewed with him.  No medication changes are necessary. Continue Lamictal 200 mg, 1 p.o. daily. Continue Invega 3 mg, 1 p.o. daily. Return in 6 months.  Melony Overly, PA-C

## 2022-03-21 ENCOUNTER — Ambulatory Visit (INDEPENDENT_AMBULATORY_CARE_PROVIDER_SITE_OTHER): Payer: 59 | Admitting: Physician Assistant

## 2022-03-21 ENCOUNTER — Encounter: Payer: Self-pay | Admitting: Physician Assistant

## 2022-03-21 DIAGNOSIS — F319 Bipolar disorder, unspecified: Secondary | ICD-10-CM

## 2022-03-21 MED ORDER — LAMOTRIGINE 200 MG PO TABS: ORAL_TABLET | ORAL | 5 refills | Status: DC

## 2022-03-21 MED ORDER — PALIPERIDONE ER 3 MG PO TB24: ORAL_TABLET | ORAL | 5 refills | Status: DC

## 2022-03-21 NOTE — Progress Notes (Signed)
[facility] ? ?Patient ID: Vernon Jenkins,  ?MRN: [id number] ? ?PCP: Salli Quarry, PA-C ? ?Date of Evaluation: 03/21/2022 ?Time spent:30 minutes ? ?Chief Complaint:  ?Chief Complaint   ?Follow-up ?  ? ? ?HISTORY/CURRENT STATUS: ?HPI For routine med check. ? ?Doing well. Still getting a good bill of health as far as the history of kidney cancer.  It has been almost 6 years now since he had a partial nephrectomy.  He is feeling well physically and still working without any problems.  He plans to retire at the end of next year. ? ?Patient denies loss of interest in usual activities and is able to enjoy things.  Denies decreased energy or motivation.  Appetite has not changed.  No extreme sadness, tearfulness, or feelings of hopelessness.  ADLs and personal hygiene are normal.  Denies any changes in concentration, making decisions or remembering things.  Does not cry easily.  He sleeps well.  Denies suicidal or homicidal thoughts. ? ?Patient denies increased energy with decreased need for sleep, no increased talkativeness, no racing thoughts, no impulsivity or risky behaviors, no increased spending, no increased libido, no grandiosity, no increased irritability or anger, no paranoia, and no hallucinations. ? ?Denies dizziness, syncope, seizures, numbness, tingling, tremor, tics, unsteady gait, slurred speech, confusion. Denies muscle or joint pain, stiffness, or dystonia. Denies unexplained weight loss, frequent infections, or sores that heal slowly.  No polyphagia, polydipsia, or polyuria. Denies visual changes or paresthesias.  ? ? ?Individual Medical History/ Review of Systems: Changes? :No  ? ?Past medications for mental health diagnoses include: ?Cymbalta caused him to be hyper and jittery, Trileptal, Risperdal  ? ?Allergies: Penicillins ? ?Current Medications:  ?Current Outpatient Medications:  ?  b complex vitamins capsule, Take 1 capsule by mouth daily., Disp: , Rfl:  ?  meloxicam (MOBIC) 15 MG  tablet, Take 1 tablet by mouth daily. prn, Disp: , Rfl:  ?  testosterone cypionate (DEPOTESTOSTERONE CYPIONATE) 200 MG/ML injection, SMARTSIG:0.7 Milliliter(s) IM Every 2 Weeks, Disp: , Rfl:  ?  lamoTRIgine (LAMICTAL) 200 MG tablet, TAKE 1 TABLET(200 MG) BY MOUTH DAILY, Disp: 30 tablet, Rfl: 5 ?  paliperidone (INVEGA) 3 MG 24 hr tablet, TAKE 1 TABLET(3 MG) BY MOUTH DAILY, Disp: 30 tablet, Rfl: 5 ?Medication Side Effects: none ? ?Family Medical/ Social History: Changes? No ? ?MENTAL HEALTH EXAM: ? ?There were no vitals taken for this visit.There is no height or weight on file to calculate BMI.  ?General Appearance: Casual and Well Groomed  ?Eye Contact:  Good  ?Speech:  Clear and Coherent and Normal Rate  ?Volume:  Normal  ?Mood:  Euthymic  ?Affect:  Appropriate  ?Thought Process:  Goal Directed and Descriptions of Associations: Circumstantial  ?Orientation:  Full (Time, Place, and Person)  ?Thought Content: Logical   ?Suicidal Thoughts:  No  ?Homicidal Thoughts:  No  ?Memory:  WNL  ?Judgement:  Good  ?Insight:  Good  ?Psychomotor Activity:  Normal  ?Concentration:  Concentration: Good  ?Recall:  Good  ?Fund of Knowledge: Good  ?Language: Good  ?Assets:  Desire for Improvement ?Financial Resources/Insurance ?Housing ?Transportation ?Vocational/Educational  ?ADL's:  Intact  ?Cognition: WNL  ?Prognosis:  Good  ? ?Labs  ?03/01/2022 ?CBC normal ?CMP glucose 103 otherwise completely normal ? ?01/16/2022 ?Total cholesterol 203, triglycerides 100, HDL 61, LDL 124 ? ? ?DIAGNOSES:  ?  ICD-10-CM   ?1. Bipolar I disorder (HCC)  F31.9   ?  ? ? ? ?Receiving Psychotherapy: No  ? ? ?RECOMMENDATIONS:  ?PDMP was  reviewed.  No results available. ?I provided 30 minutes of face to face time during this encounter, including time spent before and after the visit in records review, medical decision making, counseling pertinent to today's visit, and charting.  ?I am glad to see him doing well both physically and mentally!  No changes in meds  are necessary. ? ?Continue Lamictal 200 mg, 1 p.o. daily. ?Continue Invega 3 mg, 1 p.o. daily. ?Return in 6 months. ? ?Melony Overly, PA-C  ?

## 2022-06-20 ENCOUNTER — Other Ambulatory Visit: Payer: Self-pay | Admitting: Physician Assistant

## 2022-09-20 ENCOUNTER — Encounter: Payer: Self-pay | Admitting: Physician Assistant

## 2022-09-20 ENCOUNTER — Ambulatory Visit (INDEPENDENT_AMBULATORY_CARE_PROVIDER_SITE_OTHER): Payer: 59 | Admitting: Physician Assistant

## 2022-09-20 DIAGNOSIS — F319 Bipolar disorder, unspecified: Secondary | ICD-10-CM

## 2022-09-20 MED ORDER — PALIPERIDONE ER 3 MG PO TB24: ORAL_TABLET | ORAL | 5 refills | Status: DC

## 2022-09-20 MED ORDER — LAMOTRIGINE 200 MG PO TABS: 200.0000 mg | ORAL_TABLET | Freq: Every day | ORAL | 5 refills | Status: DC

## 2022-09-20 NOTE — Progress Notes (Signed)
[facility]  Patient ID: Vernon Jenkins,  MRN: [id number]  PCP: Salli Quarry, PA-C  Date of Evaluation: 09/20/2022 Time spent:20 minutes  Chief Complaint:  Chief Complaint   Follow-up    HISTORY/CURRENT STATUS: HPI For routine med check.  He is doing really well as far as his mental health goes. Patient is able to enjoy things.  Energy and motivation are good.  Work is going well, plans to retire at the end of next year.  No extreme sadness, tearfulness, or feelings of hopelessness.  Sleeps well most of the time. ADLs and personal hygiene are normal.   Denies any changes in concentration, making decisions, or remembering things.  Appetite has not changed.  Weight is stable.  Not isolating.  No complaints of extreme anxiety.  Denies suicidal or homicidal thoughts.  Patient denies increased energy with decreased need for sleep, increased talkativeness, racing thoughts, impulsivity or risky behaviors, increased spending, increased libido, grandiosity, increased irritability or anger, paranoia, or hallucinations.  Denies dizziness, syncope, seizures, numbness, tingling, tremor, tics, unsteady gait, slurred speech, confusion. Denies muscle or joint pain, stiffness, or dystonia. Denies unexplained weight loss, frequent infections, or sores that heal slowly.  No polyphagia, polydipsia, or polyuria. Denies visual changes or paresthesias.   Individual Medical History/ Review of Systems: Changes? :No  New dx of Hypothyroidism started on Synthoid, started on Lipitor for borderline hyperlipidemia and vascular issues, also started on lisinopril.  Past medications for mental health diagnoses include: Cymbalta caused him to be hyper and jittery, Trileptal, Risperdal   Allergies: Penicillins  Current Medications:  Current Outpatient Medications:    aspirin EC 81 MG tablet, Take 81 mg by mouth daily. Swallow whole., Disp: , Rfl:    atorvastatin (LIPITOR) 10 MG tablet, Take by mouth.,  Disp: , Rfl:    b complex vitamins capsule, Take 1 capsule by mouth daily., Disp: , Rfl:    levothyroxine (SYNTHROID) 25 MCG tablet, Take by mouth., Disp: , Rfl:    lisinopril (ZESTRIL) 10 MG tablet, Take 10 mg by mouth daily., Disp: , Rfl:    testosterone cypionate (DEPOTESTOSTERONE CYPIONATE) 200 MG/ML injection, SMARTSIG:0.7 Milliliter(s) IM Every 2 Weeks, Disp: , Rfl:    lamoTRIgine (LAMICTAL) 200 MG tablet, Take 1 tablet (200 mg total) by mouth daily., Disp: 30 tablet, Rfl: 5   meloxicam (MOBIC) 15 MG tablet, Take 1 tablet by mouth daily. prn (Patient not taking: Reported on 09/20/2022), Disp: , Rfl:    paliperidone (INVEGA) 3 MG 24 hr tablet, TAKE 1 TABLET(3 MG) BY MOUTH DAILY, Disp: 30 tablet, Rfl: 5 Medication Side Effects: none  Family Medical/ Social History: Changes? No  MENTAL HEALTH EXAM:  There were no vitals taken for this visit.There is no height or weight on file to calculate BMI.  General Appearance: Casual and Well Groomed  Eye Contact:  Good  Speech:  Clear and Coherent and Normal Rate  Volume:  Normal  Mood:  Euthymic  Affect:  Appropriate  Thought Process:  Goal Directed and Descriptions of Associations: Circumstantial  Orientation:  Full (Time, Place, and Person)  Thought Content: Logical   Suicidal Thoughts:  No  Homicidal Thoughts:  No  Memory:  WNL  Judgement:  Good  Insight:  Good  Psychomotor Activity:  Normal  Concentration:  Concentration: Good and Attention Span: Good  Recall:  Good  Fund of Knowledge: Good  Language: Good  Assets:  Desire for Improvement Financial Resources/Insurance Housing Transportation Vocational/Educational  ADL's:  Intact  Cognition: WNL  Prognosis:  Good   Labs  07/03/2022 CBC with differential essentially normal CMP essentially normal TSH 7.9 All labs followed by PCP.  DIAGNOSES:    ICD-10-CM   1. Bipolar I disorder ([place])  F31.9      Receiving Psychotherapy: No   RECOMMENDATIONS:  PDMP was reviewed.   Testosterone known to me. I provided 20 minutes of face to face time during this encounter, including time spent before and after the visit in records review, medical decision making, counseling pertinent to today's visit, and charting.   He is doing well so no change in treatment is needed.  Continue Lamictal 200 mg, 1 p.o. daily. Continue Invega 3 mg, 1 p.o. daily. Return in 6 months.  Donnal Moat, PA-C

## 2023-03-12 ENCOUNTER — Other Ambulatory Visit: Payer: Self-pay | Admitting: Physician Assistant

## 2023-03-20 ENCOUNTER — Ambulatory Visit (INDEPENDENT_AMBULATORY_CARE_PROVIDER_SITE_OTHER): Payer: 59 | Admitting: Physician Assistant

## 2023-04-02 ENCOUNTER — Encounter: Payer: Self-pay | Admitting: Physician Assistant

## 2023-04-02 ENCOUNTER — Ambulatory Visit (INDEPENDENT_AMBULATORY_CARE_PROVIDER_SITE_OTHER): Payer: 59 | Admitting: Physician Assistant

## 2023-04-02 DIAGNOSIS — F319 Bipolar disorder, unspecified: Secondary | ICD-10-CM | POA: Diagnosis not present

## 2023-04-02 MED ORDER — LAMOTRIGINE 200 MG PO TABS: 200.0000 mg | ORAL_TABLET | Freq: Every day | ORAL | 5 refills | Status: DC

## 2023-04-02 NOTE — Progress Notes (Signed)
[facility]  Patient ID: Vernon Jenkins,  MRN: [id number]  PCP: Salli Quarry, PA-C  Date of Evaluation: 04/02/2023 Time spent:20 minutes  Chief Complaint:  Chief Complaint   Follow-up    HISTORY/CURRENT STATUS: HPI For routine med check.  Doing well.  Meds continue to work well. Patient is able to enjoy things.  Energy and motivation are good.  Work is going well.  He will retire December 10, 2023.  No extreme sadness, tearfulness, or feelings of hopelessness.  Sleeps well most of the time. ADLs and personal hygiene are normal.   Denies any changes in concentration, making decisions, or remembering things.  Appetite has not changed.  Weight is stable.  Does not have a lot of anxiety at all.  Denies suicidal or homicidal thoughts.  Patient denies increased energy with decreased need for sleep, increased talkativeness, racing thoughts, impulsivity or risky behaviors, increased spending, increased libido, grandiosity, increased irritability or anger, paranoia, or hallucinations.  Denies dizziness, syncope, seizures, numbness, tingling, tremor, tics, unsteady gait, slurred speech, confusion. Denies muscle or joint pain, stiffness, or dystonia. Denies unexplained weight loss, frequent infections, or sores that heal slowly.  No polyphagia, polydipsia, or polyuria. Denies visual changes or paresthesias.   Individual Medical History/ Review of Systems: Changes? :No    Past medications for mental health diagnoses include: Cymbalta caused him to be hyper and jittery, Trileptal, Risperdal   Allergies: Penicillins  Current Medications:  Current Outpatient Medications:    aspirin EC 81 MG tablet, Take 81 mg by mouth daily. Swallow whole., Disp: , Rfl:    atorvastatin (LIPITOR) 10 MG tablet, Take by mouth., Disp: , Rfl:    b complex vitamins capsule, Take 1 capsule by mouth daily., Disp: , Rfl:    levothyroxine (SYNTHROID) 25 MCG tablet, Take by mouth., Disp: , Rfl:    lisinopril  (ZESTRIL) 10 MG tablet, Take 10 mg by mouth daily., Disp: , Rfl:    meloxicam (MOBIC) 15 MG tablet, Take 1 tablet by mouth daily. prn, Disp: , Rfl:    paliperidone (INVEGA) 3 MG 24 hr tablet, TAKE 1 TABLET(3 MG) BY MOUTH DAILY, Disp: 30 tablet, Rfl: 5   testosterone cypionate (DEPOTESTOSTERONE CYPIONATE) 200 MG/ML injection, SMARTSIG:0.7 Milliliter(s) IM Every 2 Weeks, Disp: , Rfl:    lamoTRIgine (LAMICTAL) 200 MG tablet, Take 1 tablet (200 mg total) by mouth daily., Disp: 30 tablet, Rfl: 5 Medication Side Effects: none  Family Medical/ Social History: Changes? No  MENTAL HEALTH EXAM:  There were no vitals taken for this visit.There is no height or weight on file to calculate BMI.  General Appearance: Casual and Well Groomed  Eye Contact:  Good  Speech:  Clear and Coherent and Normal Rate  Volume:  Normal  Mood:  Euthymic  Affect:  Appropriate  Thought Process:  Goal Directed and Descriptions of Associations: Circumstantial  Orientation:  Full (Time, Place, and Person)  Thought Content: Logical   Suicidal Thoughts:  No  Homicidal Thoughts:  No  Memory:  WNL  Judgement:  Good  Insight:  Good  Psychomotor Activity:  Normal  Concentration:  Concentration: Good and Attention Span: Good  Recall:  Good  Fund of Knowledge: Good  Language: Good  Assets:  Communication Skills Desire for Improvement Financial Resources/Insurance Housing Transportation Vocational/Educational  ADL's:  Intact  Cognition: WNL  Prognosis:  Good   All labs followed by PCP.  DIAGNOSES:    ICD-10-CM   1. Bipolar I disorder (HCC)  F31.9  Receiving Psychotherapy: No   RECOMMENDATIONS:  PDMP was reviewed.  Testosterone known to me. I provided 20 minutes of face to face time during this encounter, including time spent before and after the visit in records review, medical decision making, counseling pertinent to today's visit, and charting.   He continues to do well so no changes will be  made.  Continue Lamictal 200 mg, 1 p.o. daily. Continue Invega 3 mg, 1 p.o. daily. Return in 6 months.  Melony Overly, PA-C

## 2023-09-05 ENCOUNTER — Ambulatory Visit (INDEPENDENT_AMBULATORY_CARE_PROVIDER_SITE_OTHER): Payer: 59 | Admitting: Physician Assistant

## 2023-09-05 ENCOUNTER — Encounter: Payer: Self-pay | Admitting: Physician Assistant

## 2023-09-05 DIAGNOSIS — F319 Bipolar disorder, unspecified: Secondary | ICD-10-CM

## 2023-09-05 MED ORDER — PALIPERIDONE ER 3 MG PO TB24: ORAL_TABLET | ORAL | 5 refills | Status: DC

## 2023-09-05 MED ORDER — LAMOTRIGINE 200 MG PO TABS: 200.0000 mg | ORAL_TABLET | Freq: Every day | ORAL | 5 refills | Status: DC

## 2023-09-05 NOTE — Progress Notes (Signed)
[facility]  Patient ID: Vernon Jenkins,  MRN: [id number]  PCP: Salli Quarry, PA-C  Date of Evaluation: 09/05/2023 Time spent: 22 minutes  Chief Complaint:  Chief Complaint   Follow-up    HISTORY/CURRENT STATUS: HPI For 6 month med check  Doing well. Work is going fine.  Retires in 2 months! Excited but also unsure what he'll do, hopefully take a month or 2 to do only what he wants to.   Patient is able to enjoy things.  Energy and motivation are good.  No extreme sadness, tearfulness, or feelings of hopelessness.  Sleeps well.  ADLs and personal hygiene are normal.   Denies any changes in concentration, making decisions, or remembering things.  Appetite has not changed.  Weight is stable.  No c/o anxiety.  Denies suicidal or homicidal thoughts.  Patient denies increased energy with decreased need for sleep, increased talkativeness, racing thoughts, impulsivity or risky behaviors, increased spending, increased libido, grandiosity, increased irritability or anger, paranoia, or hallucinations.  Denies dizziness, syncope, seizures, numbness, tingling, tremor, tics, unsteady gait, slurred speech, confusion. Denies muscle or joint pain, stiffness, or dystonia. Denies unexplained weight loss, frequent infections, or sores that heal slowly.  No polyphagia, polydipsia, or polyuria. Denies visual changes or paresthesias.   Individual Medical History/ Review of Systems: Changes? :No  He has his annual oncology visit in a few days.  He had kidney cancer several years ago and is doing well.  Past medications for mental health diagnoses include: Cymbalta caused him to be hyper and jittery, Trileptal, Risperdal   Allergies: Penicillins  Current Medications:  Current Outpatient Medications:    aspirin EC 81 MG tablet, Take 81 mg by mouth daily. Swallow whole., Disp: , Rfl:    atorvastatin (LIPITOR) 10 MG tablet, Take 10 mg by mouth daily., Disp: , Rfl:    levothyroxine  (SYNTHROID) 25 MCG tablet, Take by mouth., Disp: , Rfl:    lisinopril (ZESTRIL) 10 MG tablet, Take 10 mg by mouth daily., Disp: , Rfl:    testosterone cypionate (DEPOTESTOSTERONE CYPIONATE) 200 MG/ML injection, SMARTSIG:0.7 Milliliter(s) IM Every 2 Weeks, Disp: , Rfl:    atorvastatin (LIPITOR) 10 MG tablet, Take by mouth., Disp: , Rfl:    b complex vitamins capsule, Take 1 capsule by mouth daily. (Patient not taking: Reported on 09/05/2023), Disp: , Rfl:    lamoTRIgine (LAMICTAL) 200 MG tablet, Take 1 tablet (200 mg total) by mouth daily., Disp: 30 tablet, Rfl: 5   meloxicam (MOBIC) 15 MG tablet, Take 1 tablet by mouth daily. prn (Patient not taking: Reported on 09/05/2023), Disp: , Rfl:    paliperidone (INVEGA) 3 MG 24 hr tablet, TAKE 1 TABLET(3 MG) BY MOUTH DAILY, Disp: 30 tablet, Rfl: 5 Medication Side Effects: none  Family Medical/ Social History: Changes? No  MENTAL HEALTH EXAM:  There were no vitals taken for this visit.There is no height or weight on file to calculate BMI.  General Appearance: Casual and Well Groomed  Eye Contact:  Good  Speech:  Clear and Coherent and Normal Rate  Volume:  Normal  Mood:  Euthymic  Affect:  Congruent  Thought Process:  Goal Directed and Descriptions of Associations: Circumstantial  Orientation:  Full (Time, Place, and Person)  Thought Content: Logical   Suicidal Thoughts:  No  Homicidal Thoughts:  No  Memory:  WNL  Judgement:  Good  Insight:  Good  Psychomotor Activity:  Normal  Concentration:  Concentration: Good  Recall:  Good  Fund of Knowledge: Good  Language: Good  Assets:  Communication Skills Desire for Improvement Financial Resources/Insurance Housing Resilience Transportation Vocational/Educational  ADL's:  Intact  Cognition: WNL  Prognosis:  Good   DIAGNOSES:    ICD-10-CM   1. Bipolar I disorder (HCC)  F31.9       Receiving Psychotherapy: No   RECOMMENDATIONS:  PDMP reviewed.  Testosterone known to me.  Small  amount of hydrocodone prescribed 05/31/2023. I provided 22 minutes of face to face time during this encounter, including time spent before and after the visit in records review, medical decision making, counseling pertinent to today's visit, and charting.   Vernon Jenkins is doing well so no changes need to be made.  He will have labs drawn at the oncologist in a few days, and his PCP also follows his labs so I will not draw anything at this time.  Continue Lamictal 200 mg, 1 p.o. daily. Continue Invega 3 mg, 1 p.o. daily. Return in 6 months.  Melony Overly, PA-C

## 2024-03-05 ENCOUNTER — Ambulatory Visit (INDEPENDENT_AMBULATORY_CARE_PROVIDER_SITE_OTHER): Payer: 59 | Admitting: Physician Assistant

## 2024-03-05 ENCOUNTER — Encounter: Payer: Self-pay | Admitting: Physician Assistant

## 2024-03-05 DIAGNOSIS — F319 Bipolar disorder, unspecified: Secondary | ICD-10-CM

## 2024-03-05 MED ORDER — LAMOTRIGINE 200 MG PO TABS
200.0000 mg | ORAL_TABLET | Freq: Every day | ORAL | 1 refills | Status: DC
Start: 2024-03-05 — End: 2024-09-04

## 2024-03-05 MED ORDER — PALIPERIDONE ER 3 MG PO TB24: ORAL_TABLET | ORAL | 1 refills | Status: DC

## 2024-03-05 NOTE — Progress Notes (Signed)
 [facility]  Patient ID: Vernon Jenkins,  MRN: [id number]  PCP: Salli Quarry, PA-C  Date of Evaluation: 03/05/2024 Time spent:20 minutes  Chief Complaint:  Chief Complaint   Follow-up    HISTORY/CURRENT STATUS: HPI For 6 month med check  Vernon Jenkins is doing very well.  He retired in early January.  He took a few months to do things around the house.  He has started volunteering at [facility] 2 days a week, 4 hours each day.  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  He is enjoying it.  He is working out daily for 1 hour at [employer] and is enjoying that as well.  He had a complete physical in January and all of his labs were normal.  He sees his oncologist every 6 months.  Feels like his mental health meds are working as they should.  Patient is able to enjoy things.  Energy and motivation are good.   No extreme sadness, tearfulness, or feelings of hopelessness.  Sleeps well most of the time. ADLs and personal hygiene are normal.   Denies any changes in concentration, making decisions, or remembering things.  Appetite has not changed.  Weight is stable.  Denies suicidal or homicidal thoughts.  Patient denies increased energy with decreased need for sleep, increased talkativeness, racing thoughts, impulsivity or risky behaviors, increased spending, increased libido, grandiosity, increased irritability or anger, paranoia, or hallucinations.  Denies dizziness, syncope, seizures, numbness, tingling, tremor, tics, unsteady gait, slurred speech, confusion. Denies muscle or joint pain, stiffness, or dystonia. Denies unexplained weight loss, frequent infections, or sores that heal slowly.  No polyphagia, polydipsia, or polyuria. Denies visual changes or paresthesias.   Individual Medical History/ Review of Systems: Changes? :No   Past medications for mental health diagnoses include: Cymbalta  caused him to be hyper and jittery, Trileptal, Risperdal   Allergies: Penicillins  Current Medications:  Current Outpatient Medications:    aspirin EC 81 MG tablet, Take 81 mg by mouth daily. Swallow whole., Disp: , Rfl:    atorvastatin (LIPITOR) 10 MG tablet, Take 10 mg by mouth daily., Disp: , Rfl:    levothyroxine (SYNTHROID) 25 MCG tablet, Take by mouth., Disp: , Rfl:    lisinopril (ZESTRIL) 10 MG tablet, Take 10 mg by mouth daily., Disp: , Rfl:    Multiple Vitamin (MULTIVITAMIN) capsule, Take 1 capsule by mouth daily., Disp: , Rfl:    testosterone cypionate (DEPOTESTOSTERONE CYPIONATE) 200 MG/ML injection, SMARTSIG:0.7 Milliliter(s) IM Every 2 Weeks, Disp: , Rfl:    atorvastatin (LIPITOR) 10 MG tablet, Take by mouth., Disp: , Rfl:    b complex vitamins capsule, Take 1 capsule by mouth daily. (Patient not taking: Reported on 03/05/2024), Disp: , Rfl:    lamoTRIgine (LAMICTAL) 200 MG tablet, Take 1 tablet (200 mg total) by mouth daily., Disp: 90 tablet, Rfl: 1   meloxicam (MOBIC) 15 MG tablet, Take 1 tablet by mouth daily. prn (Patient not taking: Reported on 03/05/2024), Disp: , Rfl:    paliperidone (INVEGA) 3 MG 24 hr tablet, TAKE 1 TABLET(3 MG) BY MOUTH DAILY, Disp: 90 tablet, Rfl: 1 Medication Side Effects: none  Family Medical/ Social History: Changes?  Retired.  See HPI.  MENTAL HEALTH EXAM:  There were no vitals taken for this visit.There is no height or weight on file to calculate BMI.  General Appearance: Casual and Well Groomed  Eye Contact:  Good  Speech:  Clear and Coherent and Normal Rate  Volume:  Normal  Mood:  Euthymic  Affect:  Congruent  Thought Process:  Goal Directed and Descriptions of Associations: Circumstantial  Orientation:  Full (Time, Place, and Person)  Thought Content: Logical   Suicidal Thoughts:  No  Homicidal Thoughts:  No  Memory:  WNL  Judgement:  Good  Insight:  Good  Psychomotor Activity:  Normal  Concentration:  Concentration: Good  Recall:   Good  Fund of Knowledge: Good  Language: Good  Assets:  Communication Skills Desire for Improvement Financial Resources/Insurance Housing Physical Health Resilience Transportation Vocational/Educational  ADL's:  Intact  Cognition: WNL  Prognosis:  Good   DIAGNOSES:    ICD-10-CM   1. Bipolar I disorder (HCC)  F31.9      Receiving Psychotherapy: No   RECOMMENDATIONS:  PDMP reviewed.  Testosterone known to me.   I provided 20 minutes of face to face time during this encounter, including time spent before and after the visit in records review, medical decision making, counseling pertinent to today's visit, and charting.   Congratulations on his retirement!  He is doing well so no changes need to be made.  Continue Lamictal 200 mg, 1 p.o. daily. Continue Invega 3 mg, 1 p.o. daily. Return in 6 months.  Melony Overly, PA-C

## 2024-08-19 ENCOUNTER — Other Ambulatory Visit: Payer: Self-pay | Admitting: Physician Assistant

## 2024-09-04 ENCOUNTER — Ambulatory Visit: Admitting: Physician Assistant

## 2024-09-04 ENCOUNTER — Encounter: Payer: Self-pay | Admitting: Physician Assistant

## 2024-09-04 DIAGNOSIS — F319 Bipolar disorder, unspecified: Secondary | ICD-10-CM

## 2024-09-04 MED ORDER — LAMOTRIGINE 200 MG PO TABS: 200.0000 mg | ORAL_TABLET | Freq: Every day | ORAL | 1 refills | Status: AC

## 2024-09-04 MED ORDER — PALIPERIDONE ER 3 MG PO TB24: ORAL_TABLET | ORAL | 1 refills | Status: AC

## 2024-09-04 NOTE — Progress Notes (Signed)
 [facility]  Patient ID: Vernon Jenkins,  MRN: [id number]  PCP: Junnie Kid, PA-C  Date of Evaluation: 09/04/2024 Time spent:20 minutes  Chief Complaint:  Chief Complaint   Follow-up    HISTORY/CURRENT STATUS: HPI For 6 month med check  Doing well. Enjoying retirement.  Energy and motivation are good.  No extreme sadness, tearfulness, or feelings of hopelessness.  Sleeps well most of the time. ADLs and personal hygiene are normal.   Denies any changes in concentration, making decisions, or remembering things.  Appetite has not changed.  Weight is stable.  No mania, delirium, AH/VH.  No SI/HI.  Individual Medical History/ Review of Systems: Changes? :Yes   dx w/ hypothyroidism. Continues to have good reports after kidney cancer several years ago.   Past medications for mental health diagnoses include: Cymbalta caused him to be hyper and jittery, Trileptal, Risperdal    Allergies: Penicillins  Current Medications:  Current Outpatient Medications:    aspirin EC 81 MG tablet, Take 81 mg by mouth daily. Swallow whole., Disp: , Rfl:    atorvastatin (LIPITOR) 10 MG tablet, Take 10 mg by mouth daily., Disp: , Rfl:    levothyroxine (SYNTHROID) 88 MCG tablet, Take 88 mcg by mouth., Disp: , Rfl:    lisinopril (ZESTRIL) 10 MG tablet, Take 10 mg by mouth daily., Disp: , Rfl:    Multiple Vitamin (MULTIVITAMIN) capsule, Take 1 capsule by mouth daily., Disp: , Rfl:    testosterone cypionate (DEPOTESTOSTERONE CYPIONATE) 200 MG/ML injection, SMARTSIG:0.7 Milliliter(s) IM Every 2 Weeks, Disp: , Rfl:    atorvastatin (LIPITOR) 10 MG tablet, Take by mouth., Disp: , Rfl:    b complex vitamins capsule, Take 1 capsule by mouth daily. (Patient not taking: Reported on 03/05/2024), Disp: , Rfl:    lamoTRIgine  (LAMICTAL ) 200 MG tablet, Take 1 tablet (200 mg total) by mouth daily., Disp: 90 tablet, Rfl: 1   levothyroxine (SYNTHROID) 25 MCG tablet, Take by mouth. (Patient not taking: Reported on  09/04/2024), Disp: , Rfl:    meloxicam (MOBIC) 15 MG tablet, Take 1 tablet by mouth daily. prn (Patient not taking: Reported on 03/05/2024), Disp: , Rfl:    paliperidone  (INVEGA ) 3 MG 24 hr tablet, TAKE 1 TABLET(3 MG) BY MOUTH DAILY, Disp: 90 tablet, Rfl: 1 Medication Side Effects: none  Family Medical/ Social History: Changes? no  MENTAL HEALTH EXAM:  There were no vitals taken for this visit.There is no height or weight on file to calculate BMI.  General Appearance: Casual and Well Groomed  Eye Contact:  Good  Speech:  Clear and Coherent and Normal Rate  Volume:  Normal  Mood:  Euthymic  Affect:  Congruent  Thought Process:  Goal Directed and Descriptions of Associations: Circumstantial  Orientation:  Full (Time, Place, and Person)  Thought Content: Logical   Suicidal Thoughts:  No  Homicidal Thoughts:  No  Memory:  WNL  Judgement:  Good  Insight:  Good  Psychomotor Activity:  Normal  Concentration:  Concentration: Good  Recall:  Good  Fund of Knowledge: Good  Language: Good  Assets:  Communication Skills Desire for Improvement Financial Resources/Insurance Housing Leisure Time Physical Health Resilience Transportation Vocational/Educational  ADL's:  Intact  Cognition: WNL  Prognosis:  Good   DIAGNOSES:    ICD-10-CM   1. Bipolar I disorder (HCC)  F31.9      Receiving Psychotherapy: No   RECOMMENDATIONS:  PDMP reviewed.  Testosterone known to me.   I provided approximately 20 minutes of face to face time  during this encounter, including time spent before and after the visit in records review, medical decision making, counseling pertinent to today's visit, and charting.   [name] is doing well so no changes are needed.   Continue Lamictal  200 mg, 1 p.o. daily. Continue Invega  3 mg, 1 p.o. daily. Return in 6 months.  [name], PA-C

## 2025-03-05 ENCOUNTER — Ambulatory Visit: Admitting: Physician Assistant
